# Patient Record
Sex: Male | Born: 1945 | Race: White | Hispanic: No | Marital: Married | Smoking: Former smoker
Health system: Southern US, Community
[De-identification: ages and names within clinical notes are randomized; demographics above are authoritative.]

## PROBLEM LIST (undated history)

## (undated) DIAGNOSIS — I10 Essential (primary) hypertension: Secondary | ICD-10-CM

## (undated) DIAGNOSIS — Z87442 Personal history of urinary calculi: Secondary | ICD-10-CM

## (undated) HISTORY — PX: REPLACEMENT TOTAL KNEE: SUR1224

---

## 2008-02-16 ENCOUNTER — Inpatient Hospital Stay (HOSPITAL_COMMUNITY): Admission: RE | Admit: 2008-02-16 | Discharge: 2008-02-19 | Payer: Self-pay | Admitting: Orthopedic Surgery

## 2008-06-28 ENCOUNTER — Inpatient Hospital Stay (HOSPITAL_COMMUNITY): Admission: RE | Admit: 2008-06-28 | Discharge: 2008-07-01 | Payer: Self-pay | Admitting: Orthopedic Surgery

## 2010-11-13 ENCOUNTER — Ambulatory Visit: Payer: BC Managed Care – PPO | Attending: Orthopedic Surgery | Admitting: Physical Therapy

## 2010-11-13 DIAGNOSIS — M25519 Pain in unspecified shoulder: Secondary | ICD-10-CM | POA: Insufficient documentation

## 2010-11-13 DIAGNOSIS — IMO0001 Reserved for inherently not codable concepts without codable children: Secondary | ICD-10-CM | POA: Insufficient documentation

## 2010-11-13 DIAGNOSIS — M6281 Muscle weakness (generalized): Secondary | ICD-10-CM | POA: Insufficient documentation

## 2010-11-13 DIAGNOSIS — M25619 Stiffness of unspecified shoulder, not elsewhere classified: Secondary | ICD-10-CM | POA: Insufficient documentation

## 2011-01-28 NOTE — Op Note (Signed)
Devin Underwood, Devin Underwood              ACCOUNT NO.:  [id number]   MEDICAL RECORD NO.:  [id number]          PATIENT TYPE:  INP   LOCATION:  0003                         FACILITY:  [facility]   PHYSICIAN:  Georges Lynch. Gioffre, M.D.DATE OF BIRTH:  Jan 05, 1946   DATE OF PROCEDURE:  02/16/2008  DATE OF DISCHARGE:                               OPERATIVE REPORT   SURGEON:  Georges Lynch. [name], M.D.   OPERATIONS:  Jamelle Rushing, P.A.   PREOPERATIVE DIAGNOSIS:  Severe degenerative arthritis, left knee, with  a genu varus deformity.   POSTOPERATIVE DIAGNOSIS:  Severe degenerative arthritis, left knee, with  a genu varus deformity.   OPERATION:  Left total knee arthroplasty utilizing the DePuy system.  The sizes used:  The patella was a size 41-mm patella.  The rotating  platform prosthesis was a size 5, 10-mm thickness.  The femoral  component was a size 5 left posterior-stabilized component femoral  component.  The tibial component was a size 4 tibial tray.  Note,  vancomycin was used in the cement.   PROCEDURE:  Under spinal anesthesia, routine orthopedic prep and drape  of the left lower extremity was carried out.  The patient had 2 g of IV  Ancef preop.  At this time after sterile prep and draping was carried  out, the leg was exsanguinated with an Esmarch, tourniquet was elevated  at 350 mmHg.  The knee was flexed.  The anterior incision was carried  out in the usual fashion.  The knee was extended and two flaps were  created.  I then carried out a median parapatellar incision and the  patella was reflected laterally, the knee was flexed, and I excised the  anterior and posterior cruciate ligaments as well as the medial and  lateral menisci.  At this particular time the initial drill holes made  in the intercondylar notch.  the hole that was widened with the widening  reamer.  We then used our canal finder to locate the canal and then  inserted our #1 jig and removed 12 mm thickness off the  distal femur.  Following that we then utilized our #2 jig and measured our appropriate-  size femur.  We measured it to be a size 5.  Following that we then  utilized our third jig and carried out anterior-posterior chamfering  cuts for a size 5 left femur.  Following that we then prepared the  tibia.  We measured the tibial tray to be a size 4.  We removed the  appropriate amount of bone from the tibia, first by inserting the canal  finder and then we utilized our intramedullary guide and made the  appropriate cut, took 4 mm thickness off the affected medial side.  After this we then cut our keel cut of the proximal tibial plateau.  Once the tibia was prepared we then went and made our notch cut in the  distal femur in the usual fashion.  The trial components were inserted  and we felt that we had the best fit with the size 10-mm thickness  tibial tray.  With the trial  components in place we then did a  resurfacing procedure on the patella after taking the appropriate  measurements.  We selected a size 41-mm patella.  Three drill holes then  were made in the patella and we then inserted our trial patella and we  had an excellent.  All trial components removed.  We thoroughly water-  picked out the knee, dried the knee out, cemented all three components  in simultaneously and, as I said above, vancomycin was used in the  cement.  After the cement was hard we removed all loose pieces of  cement.  We made sure there were no other loose pieces of cement  present.  I also injected 30 mL of 0.25% Marcaine with 30 mg of Toradol  into the surrounding soft tissue.  We thoroughly irrigated out the knee,  made sure there were no other loose pieces of cement.  We rechecked the  knee again for stability.  We had good medial and lateral stability,  good  flexion and extension with the 10-mm thickness insert, so we then  removed the trial insert and inserted our permanent rotating platform  insert.   We reduced the knee, took the knee through motion again.  We  then inserted our Hemovac drain and closed the knee in layers in the  usual fashion.  Sterile Neosporin dressing was applied.           ______________________________  Georges Lynch [name], M.D.     RAG/MEDQ  D:  02/16/2008  T:  02/16/2008  Job:  [id number]

## 2011-01-28 NOTE — H&P (Signed)
Devin Underwood, Devin Underwood              ACCOUNT NO.:  [id number]   MEDICAL RECORD NO.:  [id number]          PATIENT TYPE:  INP   LOCATION:                               FACILITY:  [facility]   PHYSICIAN:  Georges Lynch. Gioffre, M.D.DATE OF BIRTH:  [birth year]   DATE OF ADMISSION:  06/28/2008  DATE OF DISCHARGE:                              HISTORY & PHYSICAL   CHIEF COMPLAINT:  Painful right knee.   HISTORY OF PRESENT ILLNESS:  The patient is a 65 year old gentleman with  history of bilateral knee pain that has failed conservative treatment,  end-stage osteoarthritis on x-rays, had a left total knee arthroplasty  in June, 2009 with good results.  The patient is now going to proceed  with a right total knee arthroplasty by Dr. [name] on June 28, 2008.   ALLERGIES:  No known drug allergies.   FOOD ALLERGIES:  SHELLFISH.   CURRENT MEDICATIONS:  1. Lipitor 10 mg a day.  2. Celebrex 200 mg a day.  3. Over-the-counter Allegra p.r.n.   PAST MEDICAL HISTORY:  1. Seasonal allergies.  2. Hypercholesterolemia.  3. Bilateral knee arthritis with recent left total knee arthroplasty.   PAST SURGICAL HISTORY:  Includes left total knee arthroplasty, bilateral  knee arthroscopies.  He denies any complications to the above-mentioned  surgical procedures.   FAMILY MEDICAL HISTORY:  Both mother and father deceased.  Father is  deceased from an MI.  Mother is deceased from a CVA.   REVIEW OF SYSTEMS:  Is negative for any GENERAL, NEUROLOGIC,  CARDIOVASCULAR, GI, GU, ENDOCRINE or HEMATOLOGIC.   SOCIAL HISTORY:  The patient is married, lives with his wife.  He drinks  a couple of alcoholic beverages a week socially.  Currently retired.   PHYSICAL EXAMINATION:  VITAL SIGNS:  Height is 5 feet, 9 inches.  Weight  is 220 pounds.  Pulse of 70 and regular.  Respirations 12.  Blood  pressure is 136/88.  The patient is afebrile.  GENERAL:  This is a healthy-appearing well-developed gentleman who is  conscious,  alert and appropriate.  He appears to be a good historian.  He walks with a very brisk easy gait.  HEENT:  Head was normocephalic.  Pupils equal, round, reactive.  Extraocular movements intact. Gross hearing is intact.  NECK:  Supple, no palpable lymphadenopathy.  Good range of motion.  CHEST:  Lung sounds were clear and equal bilaterally with no rales,  rhonchi or wheezing.  HEART:  Regular rate and rhythm, normal murmurs, rubs or gallops.  ABDOMEN:  Soft, nontender, bowel sounds present.  EXTREMITIES:  Upper extremities have excellent range of motion of his  shoulders, elbows and wrists.  Motor strength is 5/5.  In the lower  extremities both hips have full extension and flexion with  internal/external rotation without any discomfort.  Right knee had full  extension. He was able to flex it back to 120 degrees.  Did have  crepitus in the knee.  He had no effusion, no signs of infection.  His  left knee had a well-healed midline surgical incision.  He was able to  easily  fully extend it.  He could flex it back to about 115 degrees.  He  had no instability.  Both calves were soft and nontender.  He had good  motion in both ankles.  PERIPHERAL VASCULAR:  Carotid pulses were 2+ with no bruits.  Radial  pulses ere 2+.  Posterior tibial pulses were 1+.  He had no lower  extremity edema.  He had no venous stasis changes.  No pigmentation  changes.  NEUROLOGICAL:  Patient was conscious, alert and appropriate.  No gross  neurological defects.  BREAST/RECTAL/GENITOURINARY:  Exam's were deferred at this time.   IMPRESSION:  1. End-stage osteoarthritis right knee with recent left total knee      arthroplasty.  2. Hypercholesterolemia.  3. Seasonal allergies.   PLAN:  The patient will undergo all routine labs and tests prior to  having a right total knee arthroplasty by Dr. [name] at [facility] on June 28, 2008.      Jamelle Rushing, P.A.     ______________________________  Georges Lynch [name], M.D.    RWK/MEDQ  D:  06/20/2008  T:  06/20/2008  Job:  [id number]

## 2011-01-28 NOTE — Op Note (Signed)
NAMEREYNARD, Devin Underwood              ACCOUNT NO.:  [id number]   MEDICAL RECORD NO.:  [id number]          PATIENT TYPE:  INP   LOCATION:  0007                         FACILITY:  [facility]   PHYSICIAN:  Georges Lynch. Gioffre, M.D.DATE OF BIRTH:  12-31-1945   DATE OF PROCEDURE:  06/28/2008  DATE OF DISCHARGE:                               OPERATIVE REPORT   SURGEON:  Georges Lynch. [name], M.D.   ASSISTANT:  Jamelle Rushing, P.A. and Donnie Coffin. Durwin Nora, P.A.   PREOPERATIVE DIAGNOSIS:  Severe degenerative arthritis involving the  right knee.   POSTOPERATIVE DIAGNOSIS:  Severe degenerative arthritis involving the  right knee.   OPERATION:  Total knee arthroplasty on the right.   COMPONENTS USED:  The DePuy system and I cemented all three components  in simultaneously.   SIZES USED:  We used a 38 patella with three pegs, tibial tray was a  size 5, the femur was a size 5, right femoral posterior stabilized.  The  insert was a size 5, 15 mm thickness and I used vancomycin in the  cement.   PROCEDURE:  Under spinal anesthesia routine orthopedic prepping and  draping of the right lower extremity carried out.  At this time, he had  2 grams of IV Ancef.  Following that we utilized the Esmarch to  exsanguinate the leg.  The tourniquet was elevated to 350 mmHg.  At that  time I flexed the knee and the midline incision was made in the usual  fashion.  Bleeders identified and cauterized.  Self-retaining retractors  were inserted after two flaps were created.  I then carried out a median  parapatellar incision reflecting the patella laterally, flexed the knee  and did medial and lateral meniscectomies and excised the anterior and  posterior cruciate ligaments.  At this time initial drill hole was made  in the intercondylar notch.  I removed 12 mm thickness off the distal  femur.  Following that, #2 jig was inserted for measurement purposes.  We measured the femur to be a size 5 right.  I then inserted a third  jig  and carried out anterior-posterior chamfer cuts for a size 5 right  femur.  Following that I then prepared the tibia in the usual fashion  utilizing intramedullary guide rod.  I removed 4 mm thickness off the  affected medial side of the tibia.  At this time we then cut our keel  cut onto the tibia.  After the tibia was prepared we went back to the  femur and did our notch cut in the usual fashion.  I thoroughly water  picked out the knee, then inserted our temporary trial prosthesis.  We  selected a 15-mm thickness insert.  Following that I went ahead and did  a resurfacing procedure on the patella in the usual fashion for a size  38 mm patella.  After that we then removed all trial components,  thoroughly water picked the knee out, dried the knee out and cemented  all three components in simultaneously and I did use vancomycin in the  cement.  At this time we then  removed all loose pieces of cement, water  picked the knee out again and then removed our trial insert and put our  permanent size 5, 15 mm thickness insert.  We reduced the knee, had  excellent function as far as flexion/extension and lateral medial  lateral stability.  I then inserted the  Hemovac drain and closed the knee in layered usual fashion.  Note prior  to inserting our permanent prosthesis I did inject 30 mL of 0.25%  Marcaine with epinephrine and 30 mg of Toradol.  As I said, the wound  was closed in layers in usual fashion.  Skin was closed with metal  staples.  Sterile Neosporin dressing was applied.           ______________________________  Georges Lynch [name], M.D.     RAG/MEDQ  D:  06/28/2008  T:  06/28/2008  Job:  [id number]

## 2011-01-28 NOTE — H&P (Signed)
Devin Underwood, Devin Underwood              ACCOUNT NO.:  [id number]   MEDICAL RECORD NO.:  [id number]          PATIENT TYPE:   LOCATION:                                 FACILITY:   PHYSICIAN:  Georges Lynch. Gioffre, M.D.DATE OF BIRTH:  06-22-1946   DATE OF ADMISSION:  02/16/2008  DATE OF DISCHARGE:                              HISTORY & PHYSICAL   CHIEF COMPLAINT:  Painful bilateral knees.   HISTORY OF PRESENT ILLNESS:  Devin Underwood is a 65 year old gentleman here  today for his history and physical for his upcoming left total knee  arthroplasty by Dr. [name] at [facility] on February 16, 2008.  The patient has been treated by Dr. [name] conservatively for bilateral  knee arthritic changes with medial joint bone on bone.  The patient has  finally failed to have continued improvement with conservative  treatment.  The patient has elected proceed with a left total knee  arthroplasty.   ALLERGIES:  NO KNOWN DRUG ALLERGIES.   FOOD ALLERGIES:  SHRIMP.   He does also have seasonal allergies.   CURRENT MEDICATIONS:  1. Lipitor 10 mg a day.  2. Over-the-counter Allegra.  3. Celebrex 200 mg a day.   PAST MEDICAL HISTORY:  1. Seasonal allergies.  2. Hypercholesterolemia.  3. Bilateral knee osteoarthritis.  4. Childhood asthma.   PAST SURGICAL HISTORY:  Arthroscopic procedures bilateral knees in the  mid 1990s.  The patient denies any problems with anesthesia.   REVIEW OF SYSTEMS:  Negative for any neurologic, cardiovascular, GI, GU,  endocrine and hematologic issue.  His is only issue is the pulmonary  issue with his seasonal allergies.  He did have childhood asthma.  He  has never been hospitalized.   FAMILY MEDICAL HISTORY:  Both mother and father deceased, father from an  MI, mother from CVA.   SOCIAL HISTORY:  The patient is married and lives with his wife.  He has  smoked in the past.  He drinks about two beers a week.  He is currently  retired, lives in a one-story  home.   PHYSICAL EXAMINATION:  VITAL SIGNS:  Height is 5 feet 9 inches.  Weight  is 225.  Blood pressure is 132/82, pulse of 68 and regular, respirations  are 12, nonlabored, patient is afebrile.  GENERAL:  This is a healthy-appearing gentleman, conscious, alert and  appropriate.  Appears to be a good historian.  Appears to be in no  extreme distress.  HEENT:  Head was normocephalic.  Pupils equal, round and reactive.  Good  range of motion of his cervical spine without any lymphadenopathy.  CHEST:  Lung sounds were clear and equal bilaterally.  No wheezes, rales  or rhonchi.  HEART:  Regular rate and rhythm.  No murmurs, rubs or gallops.  ABDOMEN:  Soft, nontender.  Bowel sounds present.  UPPER EXTREMITIES:  Had excellent range of motion of the shoulders,  elbows and wrists.  LOWER EXTREMITIES:  Both hips had full range of motion without any  discomfort.  The patient did have some very obvious varus deformities of  bilateral knees.  He had  full extension of both knees, flexion back to  120 degrees.  He had no ligament instability.  He had no effusions, no  signs of infection at either knee.  Calves were soft and nontender.  He  had good range of motion of both ankles.  PERIPHERAL VASCULAR:  Carotid pulses were 2+, no bruits.  Radial pulses  were 2+.  Posterior tibial pulses were 2+.  He had no lower extremity  edema.  NEURO:  The patient was conscious, alert, a little anxious.  There  appeared to be no gross neurologic defects.  BREAST/RECTAL/GU:  Deferred at this time.   IMPRESSION:  1. Severe osteoarthritis bilateral knees with loss of medial joint      spacing.  2. Hypercholesterolemia.  3. Seasonal allergies.  4. History of childhood asthma.   PLAN:  The patient will undergo all routine labs and tests prior to  having a left total knee arthroplasty by Dr. [name] at [facility] on February 16, 2008.      Jamelle Rushing, P.A.    ______________________________   Georges Lynch [name], M.D.    RWK/MEDQ  D:  02/03/2008  T:  02/03/2008  Job:  [id number]   cc:   Windy Fast A. [name], M.D.  Fax: [phone]

## 2011-01-31 NOTE — Discharge Summary (Signed)
[name], [name]              ACCOUNT NO.:  [id number]   MEDICAL RECORD NO.:  [id number]          PATIENT TYPE:  INP   LOCATION:  1619                         FACILITY:  [facility]   PHYSICIAN:  Georges Lynch. Gioffre, M.D.DATE OF BIRTH:  1946/05/09   DATE OF ADMISSION:  06/28/2008  DATE OF DISCHARGE:  07/01/2008                               DISCHARGE SUMMARY   DISPOSITION:  Home.   ADMISSION DIAGNOSES:  1. End-stage osteoarthritis right knee with recent left total knee      arthroplasty.  2. Hypercholesterolemia.  3. Seasonal allergies.   DISCHARGE DIAGNOSES:  1. Right total knee arthroplasty.  2. Routine postoperative blood loss.  3. Hypercholesterolemia.  4. Seasonal allergies.   HISTORY OF PRESENT ILLNESS:  The patient is a 65 year old gentleman with  history of bilateral knee pains progressively worsened over time.  The  patient failed conservative treatment including injections.  The patient  had end-stage osteoarthritis on his knee x-rays.  The patient had a left  total knee arthroplasty in June 2009 and did very well.  The patient has  elected to proceed with a right total knee arthroplasty.   ALLERGIES:  NO KNOWN DRUG ALLERGIES.   FOOD ALLERGIES:  SHELLFISH.   MEDICATIONS ON ADMISSION:  1. Lipitor 10 mg a day.  2. Celebrex 200 mg a day.  3. Allegra over-the-counter p.r.n.   SURGICAL PROCEDURE:  On June 28, 2008, the patient was taken to the  OR by Dr. Worthy Rancher assisted by Oneida Alar PA-C and Evern Core PA-C.  Under general anesthesia, the patient underwent a right total knee  arthroplasty without any complications.  He had minimal blood loss.  The  patient was transferred to the recovery room and then to the orthopedic  floor in good condition.  The patient had the following components  implanted:  A size 5 Sigma femoral component, size 5 keel tibial tray,  size 5 15-mm thickness polyethylene bearing, size 38  3-peg patella.  All components were implanted with  polymethyl methacrylate with  vancomycin mixed in.   CONSULTATIONS:  The following routine consults on request:  Physical  therapy,occupational therapy, case management, pharmacy.   HOSPITAL COURSE:  On June 28, 2008, the patient was admitted to  [facility] under the care of Dr. Worthy Rancher.  The patient was  taken to the OR where a right total knee arthroplasty was performed.  The patient tolerated the procedure well and was transferred to the  recovery room in good condition on IV pain medicines, antibiotics,  Coumadin and heparin for DVT prophylaxis.  The patient incurred 3 days  postoperative from which the patient did very well.  He had a little  bleeding from his wound initially, but that was well controlled with  just a pressure dressing.  The patient's wound remained benign for any  signs of infection.  The leg remained neuromotor and vascularly intact.  The patient's vital signs remained stable.  The patient had the typical  postoperative temperatures, but no signs of infection or any other  complications.  The patient worked well with physical therapy.  He was  able to wean off his IV medications to p.o. meds well without any  issues.  His hemoglobin did drop to 10.1 and 29.5 hematocrit, but the  patient tolerated this very well and was able to participate in physical  therapy.  He was able to ambulate greater than 220 feet on his date of  discharge without any difficulties.  An INR was 1.5.  He was discharged  in good condition to outpatient home health physical therapy and RN for  Coumadin monitoring.   LABORATORY DATA:  CBC on admission found WBC 6.6, hemoglobin 14.8,  hematocrit 43.4, platelets 178.  His H&H on discharge was 10.1 and 29.5.  Vital signs were stable.  The patient tolerated this well without any  other issues.  He was able to self correct with p.o. supplements.  INR  on discharge was 1.5.  On admission, his chemistries were within normal   limits.  Estimated GFR greater than 60.  Urinalysis on admission was  within normal limits.   DISCHARGE INSTRUCTIONS:  1. Diet:  No restrictions.  2. Activity:  The patient is to slowly increase activity with the use      of a walker and instructions with physical therapy.  3. Wound care:  The patient is to change the dressing on a daily      basis.   FOLLOW UP:  1. The patient needs a follow up appointment with Dr. [name] in 2      weeks from his date of surgery.  The patient is to call [phone]      for that follow up appointment.  2. Home health physical therapy through [facility] with      Coumadin monitoring.   DISCHARGE MEDICATIONS:  1. Percocet 10/650 on tablet every 4-6 hours for pain if needed.  2. Coumadin 500 mg once every 6 hours for muscle spasms if needed.  3. Coumadin 7.5 mg once a day unless changed by pharmacist through      [facility].  4. Fexofenadine 180 mg once a day.  5. Celebrex on hold until completed Coumadin.  6. Lipitor 10 mg a day.  7. Aspirin on hold until completed Coumadin.   CONDITION ON DISCHARGE:  The patient's condition upon discharge to home  is listed improved and good.      Jamelle Rushing, P.A.    ______________________________  Georges Lynch [name], M.D.   RWK/MEDQ  D:  07/19/2008  T:  07/19/2008  Job:  [id number]   cc:   Windy Fast A. [name], M.D.  Fax: [phone]

## 2011-06-11 LAB — DIFFERENTIAL
Basophils Relative: 0
Lymphocytes Relative: 27
Lymphs Abs: 1.6
Monocytes Absolute: 0.5

## 2011-06-11 LAB — URINALYSIS, ROUTINE W REFLEX MICROSCOPIC
Bilirubin Urine: NEGATIVE
Glucose, UA: NEGATIVE
Ketones, ur: NEGATIVE
Nitrite: NEGATIVE

## 2011-06-11 LAB — COMPREHENSIVE METABOLIC PANEL
ALT: 21
Albumin: 3.9
Calcium: 9.7
GFR calc Af Amer: >60
GFR calc non Af Amer: >60
Total Bilirubin: 1.2
Total Protein: 7

## 2011-06-11 LAB — APTT: aPTT: 26

## 2011-06-11 LAB — CBC
Platelets: 159
RDW: 13.1
WBC: 6.1

## 2011-06-11 LAB — PROTIME-INR
INR: 0.9
Prothrombin Time: 11.9

## 2011-06-12 LAB — PROTIME-INR
INR: 1
INR: 1.3
INR: 1.3
Prothrombin Time: 16.7 — ABNORMAL HIGH
Prothrombin Time: 16.8 — ABNORMAL HIGH

## 2011-06-12 LAB — HEMOGLOBIN AND HEMATOCRIT, BLOOD
HCT: 29.4 — ABNORMAL LOW
HCT: 32.7 — ABNORMAL LOW
Hemoglobin: 11.3 — ABNORMAL LOW
Hemoglobin: 13.8

## 2011-06-12 LAB — TYPE AND SCREEN: Antibody Screen: NEGATIVE

## 2011-06-12 LAB — CBC
Hemoglobin: 9.8 — ABNORMAL LOW
MCV: 88.8
RBC: 3.18 — ABNORMAL LOW

## 2011-06-12 LAB — ABO/RH: ABO/RH(D): O POS

## 2011-06-16 LAB — COMPREHENSIVE METABOLIC PANEL
Albumin: 3.7
Alkaline Phosphatase: 75
BUN: 16
Chloride: 105
Creatinine, Ser: 0.92
GFR calc non Af Amer: >60
Potassium: 3.9
Total Bilirubin: 1.3 — ABNORMAL HIGH
Total Protein: 6.4

## 2011-06-16 LAB — PROTIME-INR
INR: 1
INR: 1.1
INR: 1.4
INR: 1.5
Prothrombin Time: 18.1 — ABNORMAL HIGH

## 2011-06-16 LAB — CBC
Hemoglobin: 14.8
MCHC: 34.2
RBC: 4.98
RDW: 13.6

## 2011-06-16 LAB — DIFFERENTIAL
Basophils Absolute: 0
Basophils Relative: 0
Eosinophils Absolute: 0.2
Lymphs Abs: 1.6
Monocytes Absolute: 0.5
Monocytes Relative: 8
Neutro Abs: 4.2

## 2011-06-16 LAB — URINALYSIS, ROUTINE W REFLEX MICROSCOPIC
Bilirubin Urine: NEGATIVE
Hgb urine dipstick: NEGATIVE
Protein, ur: NEGATIVE

## 2011-06-16 LAB — HEMOGLOBIN AND HEMATOCRIT, BLOOD: Hemoglobin: 10.8 — ABNORMAL LOW

## 2011-06-16 LAB — TYPE AND SCREEN
ABO/RH(D): O POS
Antibody Screen: NEGATIVE

## 2021-07-18 ENCOUNTER — Encounter (HOSPITAL_BASED_OUTPATIENT_CLINIC_OR_DEPARTMENT_OTHER): Payer: Self-pay | Admitting: Emergency Medicine

## 2021-07-18 ENCOUNTER — Emergency Department (HOSPITAL_BASED_OUTPATIENT_CLINIC_OR_DEPARTMENT_OTHER)
Admission: EM | Admit: 2021-07-18 | Discharge: 2021-07-18 | Disposition: A | Discharge status: Home / Self Care | Payer: Medicare PPO

## 2021-07-18 ENCOUNTER — Emergency Department (HOSPITAL_BASED_OUTPATIENT_CLINIC_OR_DEPARTMENT_OTHER): Payer: Medicare PPO

## 2021-07-18 ENCOUNTER — Other Ambulatory Visit: Payer: Self-pay

## 2021-07-18 ENCOUNTER — Emergency Department (HOSPITAL_BASED_OUTPATIENT_CLINIC_OR_DEPARTMENT_OTHER)
Admission: EM | Admit: 2021-07-18 | Discharge: 2021-07-18 | Disposition: A | Discharge status: Home / Self Care | Payer: Medicare PPO | Attending: Emergency Medicine | Admitting: Emergency Medicine

## 2021-07-18 DIAGNOSIS — I10 Essential (primary) hypertension: Secondary | ICD-10-CM | POA: Insufficient documentation

## 2021-07-18 DIAGNOSIS — N132 Hydronephrosis with renal and ureteral calculous obstruction: Secondary | ICD-10-CM | POA: Diagnosis not present

## 2021-07-18 DIAGNOSIS — R109 Unspecified abdominal pain: Secondary | ICD-10-CM | POA: Diagnosis present

## 2021-07-18 DIAGNOSIS — Z87891 Personal history of nicotine dependence: Secondary | ICD-10-CM | POA: Insufficient documentation

## 2021-07-18 DIAGNOSIS — N23 Unspecified renal colic: Secondary | ICD-10-CM

## 2021-07-18 HISTORY — DX: Essential (primary) hypertension: I10

## 2021-07-18 LAB — CBC
HCT: 42.4 % (ref 39.0–52.0)
Hemoglobin: 14.2 g/dL (ref 13.0–17.0)
MCH: 29.8 pg (ref 26.0–34.0)
MCHC: 33.5 g/dL (ref 30.0–36.0)
MCV: 88.9 fL (ref 80.0–100.0)
Platelets: 179 10*3/uL (ref 150–400)
RBC: 4.77 MIL/uL (ref 4.22–5.81)
RDW: 12.9 % (ref 11.5–15.5)
WBC: 13 10*3/uL — ABNORMAL HIGH (ref 4.0–10.5)
nRBC: 0 % (ref 0.0–0.2)

## 2021-07-18 LAB — URINALYSIS, ROUTINE W REFLEX MICROSCOPIC
Bilirubin Urine: NEGATIVE
Glucose, UA: NEGATIVE mg/dL
Ketones, ur: NEGATIVE mg/dL
Leukocytes,Ua: NEGATIVE
Nitrite: NEGATIVE
Protein, ur: 30 mg/dL — AB
RBC / HPF: >50 RBC/hpf — ABNORMAL HIGH (ref 0–5)
Specific Gravity, Urine: 1.024 (ref 1.005–1.030)
pH: 6 (ref 5.0–8.0)

## 2021-07-18 LAB — BASIC METABOLIC PANEL
Anion gap: 7 (ref 5–15)
BUN: 23 mg/dL (ref 8–23)
CO2: 28 mmol/L (ref 22–32)
Calcium: 9.1 mg/dL (ref 8.9–10.3)
Chloride: 104 mmol/L (ref 98–111)
Creatinine, Ser: 1.03 mg/dL (ref 0.61–1.24)
GFR, Estimated: >60 mL/min (ref >60)
Glucose, Bld: 122 mg/dL — ABNORMAL HIGH (ref 70–99)
Potassium: 4.4 mmol/L (ref 3.5–5.1)
Sodium: 139 mmol/L (ref 135–145)

## 2021-07-18 MED ORDER — OXYCODONE-ACETAMINOPHEN 5-325 MG PO TABS: 0.5000 | ORAL_TABLET | Freq: Four times a day (QID) | ORAL | 0 refills | Status: DC | PRN

## 2021-07-18 MED ORDER — TAMSULOSIN HCL 0.4 MG PO CAPS: 0.4000 mg | ORAL_CAPSULE | Freq: Every day | ORAL | 0 refills | Status: AC

## 2021-07-18 NOTE — ED Provider Notes (Signed)
[facility] EMERGENCY DEPT Provider Note   CSN: [id number] Arrival date & time: 07/18/21  1121     History Chief Complaint  Patient presents with   Flank Pain    right    Devin Underwood is a 75 y.o. male.  Patient with history of hypertension presents to the emergency department today for evaluation of acute onset of right flank pain late this morning.  He had associated nausea but no vomiting.  No chest pain or shortness of breath.  No stool changes recently, at least since he had COVID 2 weeks ago.  No hematuria, dysuria, increased frequency or urgency.  Upon arrival to the emergency department, his pain seemed to "ease off".  He is more comfortable now.  Patient's wife states that he was very uncomfortable and in a lot of pain earlier.  No history of kidney stones.      Past Medical History:  Diagnosis Date   Hypertension     There are no problems to display for this patient.   The histories are not reviewed yet. Please review them in the "History" navigator section and refresh this SmartLink.     No family history on file.  Social History   Tobacco Use   Smoking status: Former    Types: Cigarettes  Substance Use Topics   Alcohol use: Yes   Drug use: Not Currently    Home Medications Prior to Admission medications   Not on File    Allergies    Patient has no known allergies.  Review of Systems   Review of Systems  Constitutional:  Negative for fever.  HENT:  Negative for rhinorrhea and sore throat.   Eyes:  Negative for redness.  Respiratory:  Negative for cough.   Cardiovascular:  Negative for chest pain.  Gastrointestinal:  Positive for nausea. Negative for abdominal pain, diarrhea and vomiting.  Genitourinary:  Positive for flank pain. Negative for dysuria and hematuria.  Musculoskeletal:  Negative for myalgias.  Skin:  Negative for rash.  Neurological:  Negative for headaches.   Physical Exam Updated Vital Signs BP (!) 153/76  (BP Location: Right Arm)   Pulse (!) 57   Temp 97.7 F (36.5 C) (Oral)   Resp 18   Ht 5\' 9"  (1.753 m)   Wt 104.3 kg   SpO2 98%   BMI 33.97 kg/m   Physical Exam Vitals and nursing note reviewed.  Constitutional:      General: He is not in acute distress.    Appearance: He is well-developed.  HENT:     Head: Normocephalic and atraumatic.  Eyes:     General:        Right eye: No discharge.        Left eye: No discharge.     Conjunctiva/sclera: Conjunctivae normal.  Cardiovascular:     Rate and Rhythm: Normal rate and regular rhythm.     Heart sounds: Normal heart sounds.  Pulmonary:     Effort: Pulmonary effort is normal.     Breath sounds: Normal breath sounds.  Abdominal:     Palpations: Abdomen is soft.     Tenderness: There is no abdominal tenderness. There is no right CVA tenderness, left CVA tenderness, guarding or rebound.  Musculoskeletal:     Cervical back: Normal range of motion and neck supple.  Skin:    General: Skin is warm and dry.  Neurological:     Mental Status: He is alert.    ED Results / Procedures /  Treatments   Labs (all labs ordered are listed, but only abnormal results are displayed) Labs Reviewed  BASIC METABOLIC PANEL - Abnormal; Notable for the following components:      Result Value   Glucose, Bld 122 (*)    All other components within normal limits  CBC - Abnormal; Notable for the following components:   WBC 13.0 (*)    All other components within normal limits  URINALYSIS, ROUTINE W REFLEX MICROSCOPIC - Abnormal; Notable for the following components:   Hgb urine dipstick LARGE (*)    Protein, ur 30 (*)    RBC / HPF >50 (*)    Bacteria, UA FEW (*)    Non Squamous Epithelial 0-5 (*)    All other components within normal limits    EKG None  Radiology CT Renal Stone Study  Result Date: 07/18/2021 CLINICAL DATA:  Right flank pain beginning today EXAM: CT ABDOMEN AND PELVIS WITHOUT CONTRAST TECHNIQUE: Multidetector CT imaging of  the abdomen and pelvis was performed following the standard protocol without IV contrast. COMPARISON:  None. FINDINGS: Lower chest: Calcified nodules at the lung bases indicative of prior granulomatous inflammation. Hepatobiliary: No focal liver abnormality is seen. No gallstones, gallbladder wall thickening, or biliary dilatation. Pancreas: Unremarkable. No pancreatic ductal dilatation or surrounding inflammatory changes. Spleen: No splenic injury or perisplenic hematoma. Adrenals/Urinary Tract: Adrenal glands are normal in appearance. Mild right hydronephrosis and hydroureter due to 4 mm obstructing calculus in the right proximal ureter. Minimal right perinephric fat stranding is present. Additional punctate nonobstructing calculus seen in the mid right kidney. Kidneys otherwise normal. No significant abnormality of the bladder. Stomach/Bowel: Stomach is within normal limits. Appendix is not definitively identified. No evidence of bowel wall thickening, distention, or inflammatory changes. Vascular/Lymphatic: No enlarged abdominal or pelvic lymph nodes. There are scattered calcified atheromatous plaque seen throughout the abdominal aorta. Reproductive: Mildly enlarged prostate with central calcifications. 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 Musculoskeletal: Advanced degenerative changes of the lumbar spine. IMPRESSION: 4 mm obstructing calculus in the right proximal ureter resulting in mild right hydronephrosis and hydroureter. Electronically Signed   By: Miachel Roux M.D.   On: 07/18/2021 13:27    Procedures Procedures   Medications Ordered in ED Medications - No data to display  ED Course  I have reviewed the triage vital signs and the nursing notes.  Pertinent labs & imaging results that were available during my care of the patient were reviewed by me and considered in my medical decision making (see chart for details).  Patient seen and examined.  Patient with elevated white  blood cell count and UA shows microscopic hematuria.  History is consistent with renal colic, however patient has never had a kidney stone in the past.  Discussed imaging with patient and family.  They elect to proceed to rule out other potential etiologies.  Noncontrast CT ordered.  Vital signs reviewed and are as follows: BP (!) 153/76 (BP Location: Right Arm)   Pulse (!) 57   Temp 97.7 F (36.5 C) (Oral)   Resp 18   Ht 5\' 9"  (1.753 m)   Wt 104.3 kg   SpO2 98%   BMI 33.97 kg/m   Patient and family updated on results.  CT imaging demonstrates 4 mm right proximal ureteral stone.  No other problems.  Pain remains controlled at this time.  Patient requesting discharge.  1:50 PM Patient counseled on kidney stone treatment. Urged patient to strain urine and save any stones. Urged  urology follow-up and return to [facility] with any complications. Counseled patient to maintain good fluid intake.   Counseled patient on use of Flomax.   Patient counseled on use of narcotic pain medications. Counseled not to combine these medications with others containing tylenol. Urged not to drink alcohol, drive, or perform any other activities that requires focus while taking these medications. The patient verbalizes understanding and agrees with the plan.  Use pain medication only under direct supervision at the lowest possible dose needed to control your pain.      MDM Rules/Calculators/A&P                           Patient with description of flank pain consistent with ureteral colic.  This is his first episode.  CT confirms 4 mm ureteral stone.  Patient's pain actually resolved upon arrival to the emergency department and has not returned.  Plan for discharged home with pain control, Flomax, urology follow-up.  Encouraged good hydration.    Final Clinical Impression(s) / ED Diagnoses Final diagnoses:  Ureteral colic    Rx / DC Orders ED Discharge Orders          Ordered    oxyCODONE-acetaminophen  (PERCOCET/ROXICET) 5-325 MG tablet  Every 6 hours PRN        07/18/21 1348    tamsulosin (FLOMAX) 0.4 MG CAPS capsule  Daily        07/18/21 1348             Carlisle Cater, PA-C 07/18/21 1351    Regan Lemming, MD 07/18/21 1454

## 2021-07-18 NOTE — ED Triage Notes (Signed)
Right flank pain started today , denies hx kidney stones , denies urinary symptoms.

## 2021-07-18 NOTE — Discharge Instructions (Signed)
Please read and follow all provided instructions.  Your diagnoses today include:  1. Ureteral colic     Tests performed today include: Urine test that showed blood in your urine and no infection CT scan which showed a 4 millimeter kidney on the right side Blood test that showed normal kidney function Vital signs. See below for your results today.   Medications prescribed:  Percocet (oxycodone/acetaminophen) - narcotic pain medication  DO NOT drive or perform any activities that require you to be awake and alert because this medicine can make you drowsy. BE VERY CAREFUL not to take multiple medicines containing Tylenol (also called acetaminophen). Doing so can lead to an overdose which can damage your liver and cause liver failure and possibly death.  Use pain medication only under direct supervision at the lowest possible dose needed to control your pain.   Flomax (tamsulosin) - relaxes smooth muscle to help kidney stones pass  Take any prescribed medications only as directed.  Home care instructions:  Follow any educational materials contained in this packet.  Please double your fluid intake for the next several days. Strain your urine and save any stones that may pass.   BE VERY CAREFUL not to take multiple medicines containing Tylenol (also called acetaminophen). Doing so can lead to an overdose which can damage your liver and cause liver failure and possibly death.   Follow-up instructions: Please follow-up with your urologist or the urologist referral (provided on front page) in the next 1 week for further evaluation of your symptoms.  If you need to return to the Emergency Department, go to [facility] and not [facility]. The urologists are located at [facility] and can better care for you at this location.  Return instructions:  If you need to return to the Emergency Department, go to [facility] and not [facility]. The urologists are  located at [facility] and can better care for you at this location.  Please return to the Emergency Department if you experience worsening symptoms.  Please return if you develop fever or uncontrolled pain or vomiting. Please return if you have any other emergent concerns.  Additional Information:  Your vital signs today were: BP (!) 147/81   Pulse 82   Temp 97.7 F (36.5 C) (Oral)   Resp 18   Ht 5\' 9"  (1.753 m)   Wt 104.3 kg   SpO2 98%   BMI 33.97 kg/m  If your blood pressure (BP) was elevated above 135/85 this visit, please have this repeated by your doctor within one month. --------------

## 2023-10-14 IMAGING — CT CT RENAL STONE PROTOCOL
2 of 4 series · 16 of 46 positions shown, 18 images · non-contrast
Comparison: None.

CLINICAL DATA: Right flank pain beginning today

EXAM:
CT ABDOMEN AND PELVIS WITHOUT CONTRAST
TECHNIQUE: Multidetector CT imaging of the abdomen and pelvis was performed
following the standard protocol without IV contrast.

[Series 2: stone full · axial · 0.84mm/px · z∈[-644,-164]mm · 13 of 106 slices shown, 15 images]
[im 5/106  soft-tissue]
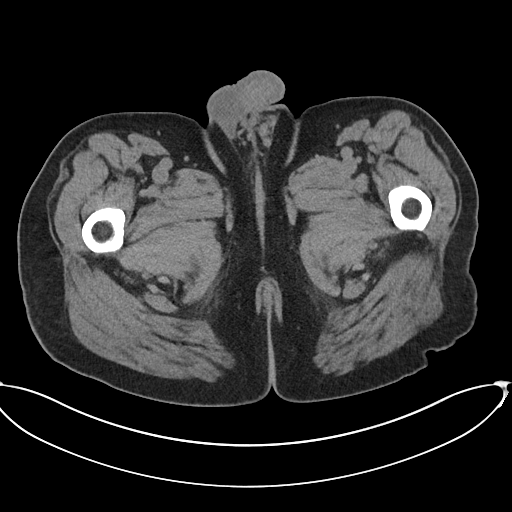
[im 5/106  bone]
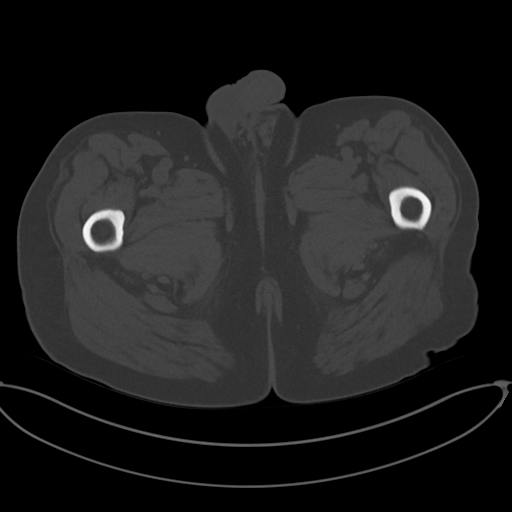
[im 14/106  soft-tissue]
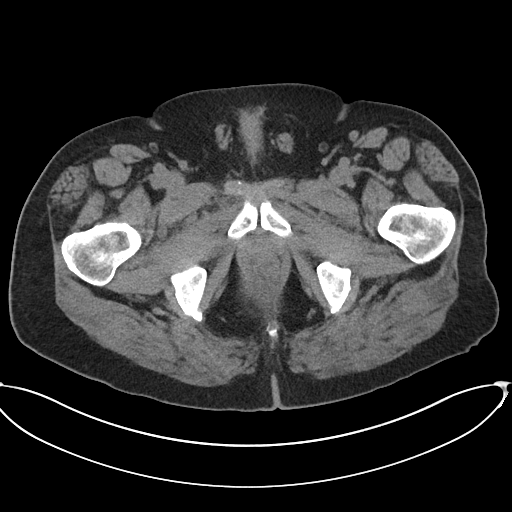
[im 23/106  soft-tissue]
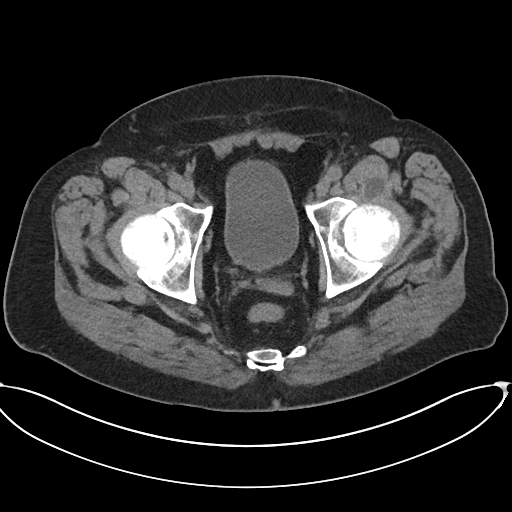
[im 28/106  soft-tissue]
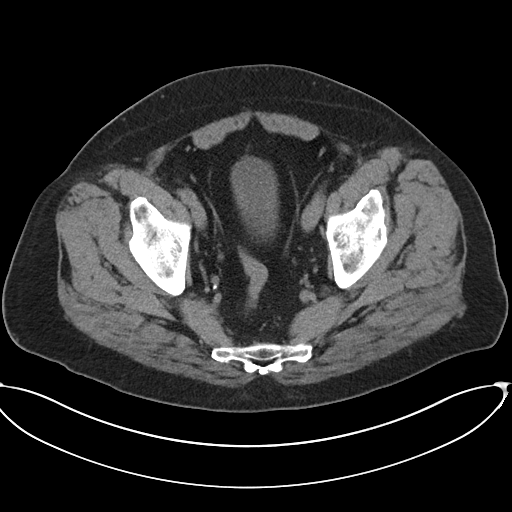
[im 37/106  soft-tissue]
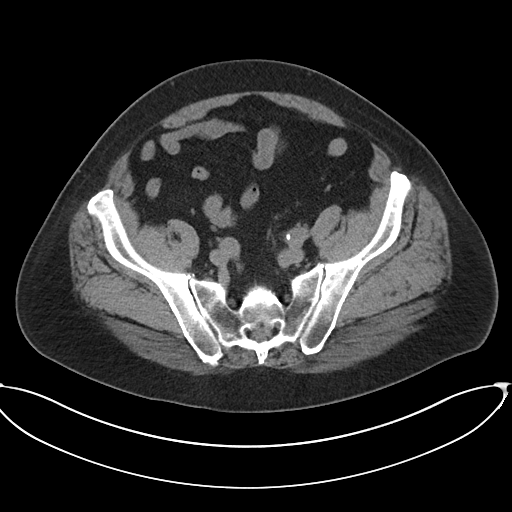
[im 46/106  soft-tissue]
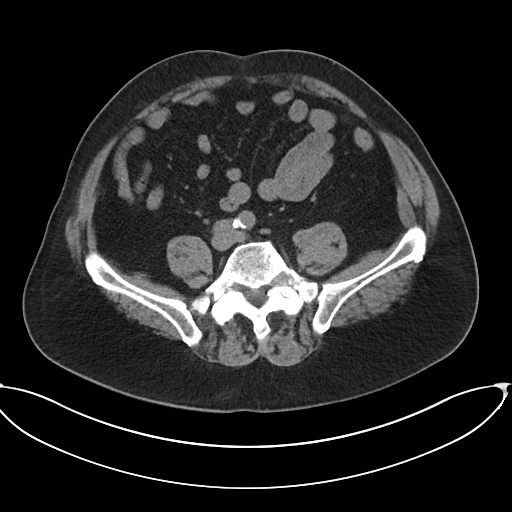
[im 55/106  soft-tissue]
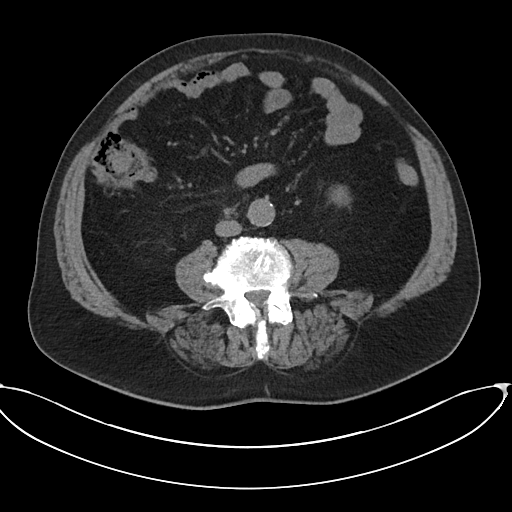
[im 60/106  soft-tissue]
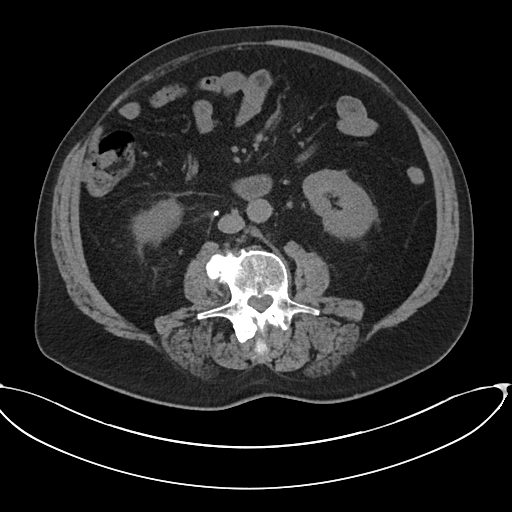
[im 69/106  soft-tissue]
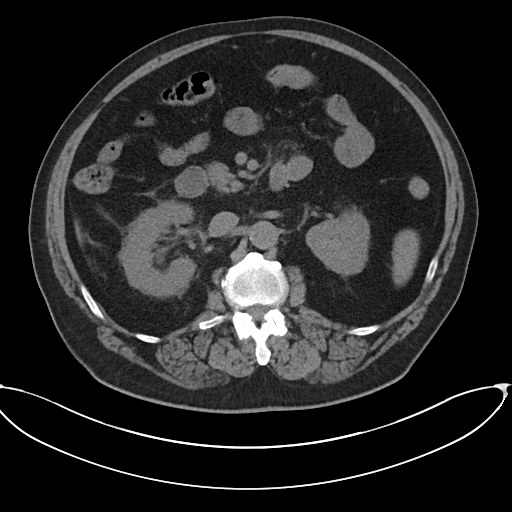
[im 69/106  bone]
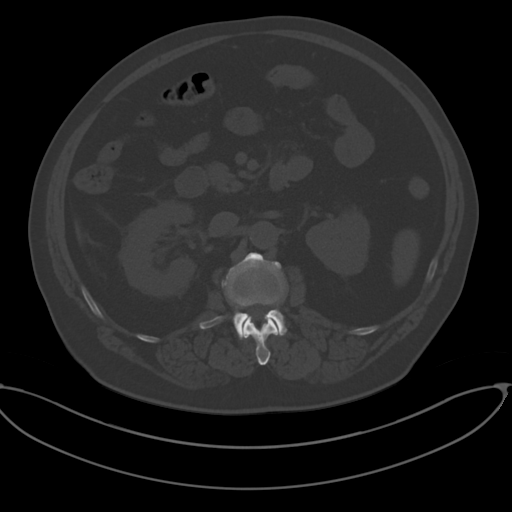
[im 78/106  soft-tissue]
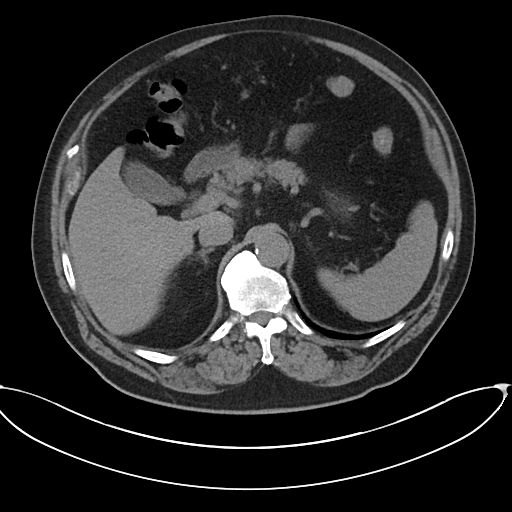
[im 83/106  soft-tissue]
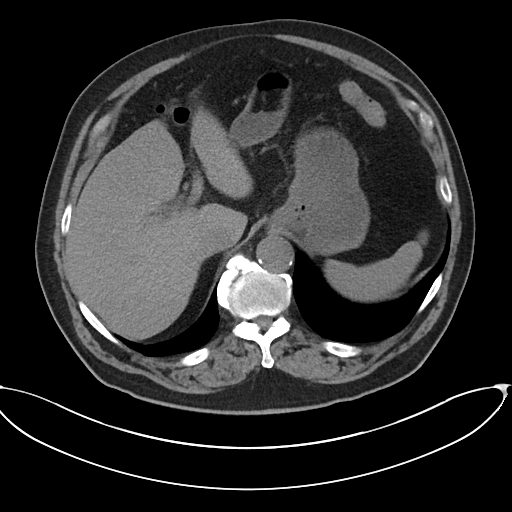
[im 92/106  soft-tissue]
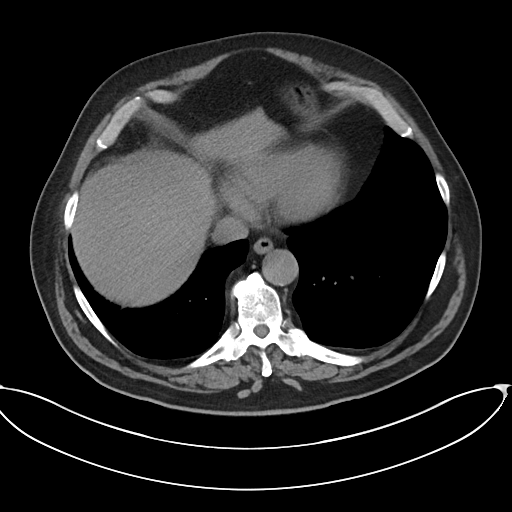
[im 101/106  soft-tissue]
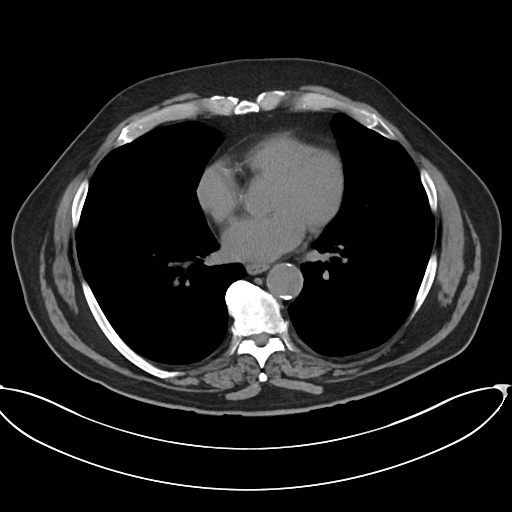

[Series 5: coronal · coronal · 0.85mm/px · 3 of 113 slices shown]
[im 38/113  soft-tissue]
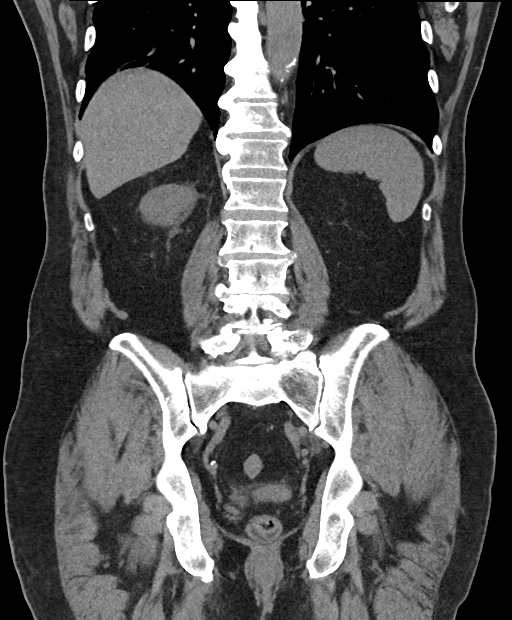
[im 50/113  soft-tissue]
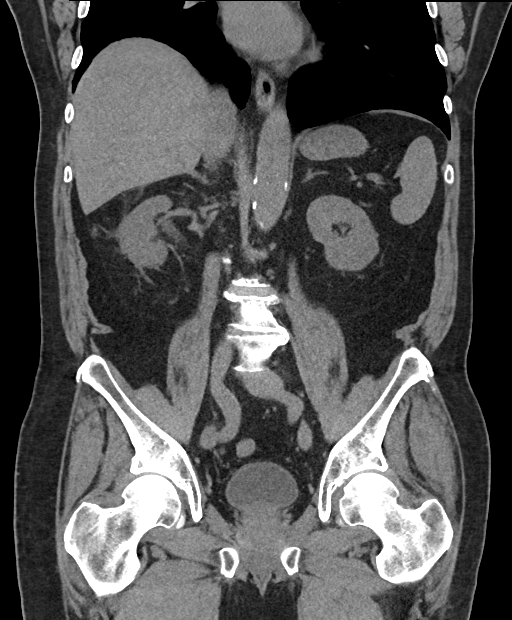
[im 63/113  soft-tissue]
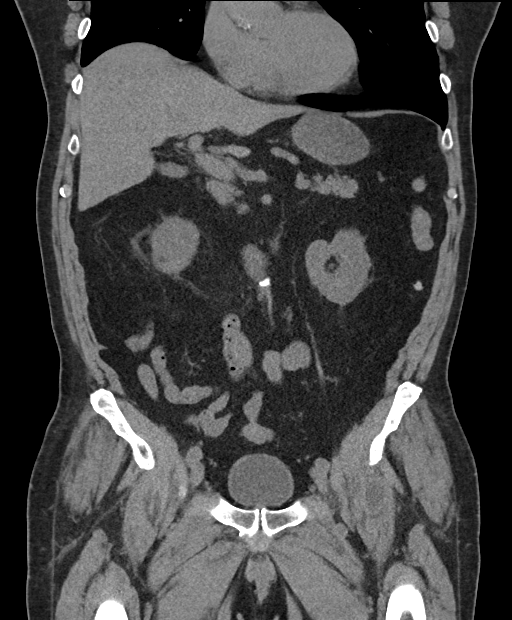

[16 of 46 positions shown; findings below may reference images not displayed]

FINDINGS: Lower chest: Calcified nodules at the lung bases indicative of prior
granulomatous inflammation.

Hepatobiliary: No focal liver abnormality is seen. No gallstones,
gallbladder wall thickening, or biliary dilatation.

Pancreas: Unremarkable. No pancreatic ductal dilatation or
surrounding inflammatory changes.

Spleen: No splenic injury or perisplenic hematoma.

Adrenals/Urinary Tract: Adrenal glands are normal in appearance.

Mild right hydronephrosis and hydroureter due to 4 mm obstructing
calculus in the right proximal ureter. Minimal right perinephric fat
stranding is present. Additional punctate nonobstructing calculus
seen in the mid right kidney. Kidneys otherwise normal. No
significant abnormality of the bladder.

Stomach/Bowel: Stomach is within normal limits. Appendix is not
definitively identified. No evidence of bowel wall thickening,
distention, or inflammatory changes.

Vascular/Lymphatic: No enlarged abdominal or pelvic lymph nodes.
There are scattered calcified atheromatous plaque seen throughout
the abdominal aorta.

Reproductive: Mildly enlarged prostate with central calcifications.

Other: Advanced degenerative changes of the lumbar spine.

Musculoskeletal: Advanced degenerative changes of the lumbar spine.
IMPRESSION: 4 mm obstructing calculus in the right proximal ureter resulting in
mild right hydronephrosis and hydroureter.

## 2024-02-17 ENCOUNTER — Other Ambulatory Visit (HOSPITAL_BASED_OUTPATIENT_CLINIC_OR_DEPARTMENT_OTHER): Payer: Self-pay | Admitting: Urology

## 2024-02-17 ENCOUNTER — Telehealth (HOSPITAL_BASED_OUTPATIENT_CLINIC_OR_DEPARTMENT_OTHER): Payer: Self-pay

## 2024-02-17 DIAGNOSIS — R310 Gross hematuria: Secondary | ICD-10-CM

## 2024-02-18 ENCOUNTER — Ambulatory Visit (HOSPITAL_BASED_OUTPATIENT_CLINIC_OR_DEPARTMENT_OTHER)
Admission: RE | Admit: 2024-02-18 | Discharge: 2024-02-18 | Disposition: A | Discharge status: Home / Self Care | Source: Ambulatory Visit | Attending: Urology | Admitting: Urology

## 2024-02-18 DIAGNOSIS — R310 Gross hematuria: Secondary | ICD-10-CM | POA: Diagnosis present

## 2024-03-01 ENCOUNTER — Other Ambulatory Visit: Payer: Self-pay | Admitting: Urology

## 2024-03-21 ENCOUNTER — Encounter (HOSPITAL_COMMUNITY): Payer: Self-pay | Admitting: Urology

## 2024-03-21 NOTE — Progress Notes (Addendum)
 Spoke w/ via phone for pre-op interview--- Devin Underwood needs dos---- BMP and EKG per anesthesia.        Underwood results------ COVID test -----patient states asymptomatic no test needed Arrive at -------0800 NPO after MN NO Solid Food.  Pre-Surgery Ensure or G2:  Med rec completed Medications to take morning of surgery -----NONE Diabetic medication -----  GLP1 agonist last dose: GLP1 instructions:  Patient instructed no nail polish to be worn day of surgery Patient instructed to bring photo id and insurance card day of surgery Patient aware to have Driver (ride ) / caregiver    for 24 hours after surgery - Wife Devin Underwood Patient Special Instructions ----- Shower with antibacterial soap. Per patient he was instructed to hold ASA starting 7 days prior to surgery. Pre-Op special Instructions -----  Patient verbalized understanding of instructions that were given at this phone interview. Patient denies chest pain, sob, fever, cough at the interview.

## 2024-03-28 NOTE — Progress Notes (Signed)
 Called and talked to patient about time change in surgery schedule. To be here at 0700 clear liquids until 0600. Ok with time change

## 2024-03-29 ENCOUNTER — Encounter (HOSPITAL_COMMUNITY)
Admission: RE | Disposition: A | Discharge status: Home / Self Care | Payer: Self-pay | Source: Home / Self Care | Attending: Urology

## 2024-03-29 ENCOUNTER — Ambulatory Visit (HOSPITAL_COMMUNITY): Admitting: Anesthesiology

## 2024-03-29 ENCOUNTER — Ambulatory Visit (HOSPITAL_COMMUNITY)
Admission: RE | Admit: 2024-03-29 | Discharge: 2024-03-29 | Disposition: A | Discharge status: Home / Self Care | Attending: Urology | Admitting: Urology

## 2024-03-29 ENCOUNTER — Encounter (HOSPITAL_COMMUNITY): Payer: Self-pay | Admitting: Urology

## 2024-03-29 ENCOUNTER — Ambulatory Visit (HOSPITAL_COMMUNITY)

## 2024-03-29 DIAGNOSIS — Z87891 Personal history of nicotine dependence: Secondary | ICD-10-CM | POA: Insufficient documentation

## 2024-03-29 DIAGNOSIS — Z79899 Other long term (current) drug therapy: Secondary | ICD-10-CM | POA: Insufficient documentation

## 2024-03-29 DIAGNOSIS — M1990 Unspecified osteoarthritis, unspecified site: Secondary | ICD-10-CM | POA: Insufficient documentation

## 2024-03-29 DIAGNOSIS — Z7982 Long term (current) use of aspirin: Secondary | ICD-10-CM | POA: Diagnosis not present

## 2024-03-29 DIAGNOSIS — I491 Atrial premature depolarization: Secondary | ICD-10-CM | POA: Diagnosis not present

## 2024-03-29 DIAGNOSIS — Z87442 Personal history of urinary calculi: Secondary | ICD-10-CM | POA: Insufficient documentation

## 2024-03-29 DIAGNOSIS — N210 Calculus in bladder: Secondary | ICD-10-CM

## 2024-03-29 DIAGNOSIS — Z96653 Presence of artificial knee joint, bilateral: Secondary | ICD-10-CM | POA: Diagnosis not present

## 2024-03-29 DIAGNOSIS — I10 Essential (primary) hypertension: Secondary | ICD-10-CM | POA: Insufficient documentation

## 2024-03-29 HISTORY — DX: Personal history of urinary calculi: Z87.442

## 2024-03-29 HISTORY — PX: CYSTOSCOPY WITH LITHOLAPAXY: SHX1425

## 2024-03-29 LAB — BASIC METABOLIC PANEL WITH GFR
Anion gap: 8 (ref 5–15)
BUN: 16 mg/dL (ref 8–23)
CO2: 26 mmol/L (ref 22–32)
Calcium: 8.9 mg/dL (ref 8.9–10.3)
Chloride: 102 mmol/L (ref 98–111)
Creatinine, Ser: 0.97 mg/dL (ref 0.61–1.24)
GFR, Estimated: >60 mL/min (ref >60)
Glucose, Bld: 98 mg/dL (ref 70–99)
Potassium: 4.5 mmol/L (ref 3.5–5.1)
Sodium: 136 mmol/L (ref 135–145)

## 2024-03-29 SURGERY — CYSTOSCOPY, WITH BLADDER CALCULUS LITHOLAPAXY
Anesthesia: General

## 2024-03-29 MED ORDER — LIDOCAINE 2% (20 MG/ML) 5 ML SYRINGE
INTRAMUSCULAR | Status: DC | PRN
  Administered 2024-03-29: 40 mg via INTRAVENOUS

## 2024-03-29 MED ORDER — TRANEXAMIC ACID-NACL 1000-0.7 MG/100ML-% IV SOLN
INTRAVENOUS | Status: AC
  Filled 2024-03-29: qty 100

## 2024-03-29 MED ORDER — PROPOFOL 10 MG/ML IV BOLUS
INTRAVENOUS | Status: AC
  Filled 2024-03-29: qty 20

## 2024-03-29 MED ORDER — LACTATED RINGERS IV SOLN: INTRAVENOUS | Status: DC

## 2024-03-29 MED ORDER — ONDANSETRON HCL 4 MG/2ML IJ SOLN
INTRAMUSCULAR | Status: DC | PRN
  Administered 2024-03-29: 4 mg via INTRAVENOUS

## 2024-03-29 MED ORDER — CIPROFLOXACIN IN D5W 400 MG/200ML IV SOLN
400.0000 mg | INTRAVENOUS | Status: AC
  Administered 2024-03-29: 400 mg via INTRAVENOUS

## 2024-03-29 MED ORDER — DEXAMETHASONE SODIUM PHOSPHATE 10 MG/ML IJ SOLN
INTRAMUSCULAR | Status: DC | PRN
  Administered 2024-03-29: 10 mg via INTRAVENOUS

## 2024-03-29 MED ORDER — FENTANYL CITRATE (PF) 250 MCG/5ML IJ SOLN
INTRAMUSCULAR | Status: AC
Start: 2024-03-29 — End: 2024-03-29
  Filled 2024-03-29: qty 5

## 2024-03-29 MED ORDER — NITROFURANTOIN MONOHYD MACRO 100 MG PO CAPS: 100.0000 mg | ORAL_CAPSULE | Freq: Two times a day (BID) | ORAL | 0 refills | Status: AC

## 2024-03-29 MED ORDER — TRAMADOL HCL 50 MG PO TABS: 50.0000 mg | ORAL_TABLET | Freq: Four times a day (QID) | ORAL | 0 refills | Status: AC | PRN

## 2024-03-29 MED ORDER — CHLORHEXIDINE GLUCONATE 0.12 % MT SOLN
15.0000 mL | Freq: Once | OROMUCOSAL | Status: AC
  Administered 2024-03-29: 15 mL via OROMUCOSAL

## 2024-03-29 MED ORDER — TRANEXAMIC ACID-NACL 1000-0.7 MG/100ML-% IV SOLN
INTRAVENOUS | Status: DC | PRN
  Administered 2024-03-29: 1000 mg via INTRAVENOUS

## 2024-03-29 MED ORDER — STERILE WATER FOR IRRIGATION IR SOLN
Status: DC | PRN
  Administered 2024-03-29: 3000 mL

## 2024-03-29 MED ORDER — FENTANYL CITRATE (PF) 250 MCG/5ML IJ SOLN
INTRAMUSCULAR | Status: DC | PRN
  Administered 2024-03-29: 75 ug via INTRAVENOUS
  Administered 2024-03-29: 25 ug via INTRAVENOUS

## 2024-03-29 MED ORDER — KETOROLAC TROMETHAMINE 30 MG/ML IJ SOLN
INTRAMUSCULAR | Status: DC | PRN
Start: 2024-03-29 — End: 2024-03-29
  Administered 2024-03-29: 15 mg via INTRAVENOUS

## 2024-03-29 MED ORDER — CHLORHEXIDINE GLUCONATE 0.12 % MT SOLN
OROMUCOSAL | Status: AC
  Filled 2024-03-29: qty 15

## 2024-03-29 MED ORDER — SODIUM CHLORIDE 0.9 % IR SOLN
Status: DC | PRN
  Administered 2024-03-29: 3000 mL
  Administered 2024-03-29: 3000 mL via INTRAVESICAL

## 2024-03-29 MED ORDER — HYOSCYAMINE SULFATE 0.125 MG SL SUBL: 0.1250 mg | SUBLINGUAL_TABLET | SUBLINGUAL | 0 refills | Status: AC | PRN

## 2024-03-29 MED ORDER — PROPOFOL 10 MG/ML IV BOLUS
INTRAVENOUS | Status: DC | PRN
  Administered 2024-03-29: 150 mg via INTRAVENOUS
  Administered 2024-03-29: 50 mg via INTRAVENOUS

## 2024-03-29 MED ORDER — CIPROFLOXACIN IN D5W 400 MG/200ML IV SOLN
INTRAVENOUS | Status: AC
  Filled 2024-03-29: qty 200

## 2024-03-29 MED ORDER — ORAL CARE MOUTH RINSE: 15.0000 mL | Freq: Once | OROMUCOSAL | Status: AC

## 2024-03-29 SURGICAL SUPPLY — 13 items
BAG URO CATCHER STRL LF (MISCELLANEOUS) ×1 IMPLANT
CATH URETL OPEN END 6FR 70 (CATHETERS) IMPLANT
GLOVE BIO SURGEON STRL SZ7.5 (GLOVE) ×1 IMPLANT
GOWN STRL REUS W/ TWL LRG LVL3 (GOWN DISPOSABLE) ×1 IMPLANT
GOWN STRL SURGICAL XL XLNG (GOWN DISPOSABLE) ×1 IMPLANT
KIT TURNOVER KIT B (KITS) ×1 IMPLANT
LASER FIB FLEXIVA PULSE ID 550 (Laser) ×1 IMPLANT
LASER FIB FLEXIVA PULSE ID 910 (Laser) ×1 IMPLANT
MANIFOLD NEPTUNE II (INSTRUMENTS) ×1 IMPLANT
PACK CYSTO (CUSTOM PROCEDURE TRAY) ×1 IMPLANT
SYRINGE TOOMEY IRRIG 70ML (MISCELLANEOUS) IMPLANT
TUBE CONNECTING 12X1/4 (SUCTIONS) IMPLANT
TUBING UROLOGY SET (TUBING) ×1 IMPLANT

## 2024-03-29 NOTE — Op Note (Signed)
 Preoperative diagnosis:  1. Bladder stone  Postoperative diagnosis: 1. same  Procedure(s): 1. Cystolithalopaxy (~1.5 cm stone)  Surgeon: Dr. Herlene Foot  Anesthesia: general  Complications: none  EBL: 10 cc  Specimens: Bladder stone  Intraoperative findings: ~1.5 cm stone in bladder, laser used to dust. Fragments evacuated through scope. Bugbee used to cauterize prostate  Description of procedure:  Patient was seen in preoperative holding.  Procedure risks explained in detail and consent signed.  Patient was brought to the operative suite where anesthesia was induced.  Prepped and draped in the usual sterile fashion in a dorsal lithotomy position.  Timeout was performed.  Began by carefully inserting the rigid cystoscope.  Bladder was carefully examined.  Urethra was notable for some trilobar enlargement of his prostate.  Aside from this time there were no abnormalities in the bladder.  A 550 m laser was then carefully used to fragment the stone.  Fragments were evacuated through the cystoscope.  Given the patient's prostate enlargement there was some bleeding afterward so I elected to use the Bugbee to fold straight these bleeders.  Patient was also given TXA.  Patient tolerated the procedure well was brought to the PACU in stable condition  Herlene Foot MD 03/29/2024, 10:54 AM  [facility]  Pager: [phone]

## 2024-03-29 NOTE — Anesthesia Postprocedure Evaluation (Signed)
 Anesthesia Post Note  Patient: Devin Underwood  Procedure(s) Performed: CYSTOSCOPY, WITH BLADDER CALCULUS LITHOLAPAXY     Patient location during evaluation: PACU Anesthesia Type: General Level of consciousness: awake and alert, oriented and patient cooperative Pain management: pain level controlled Vital Signs Assessment: post-procedure vital signs reviewed and stable Respiratory status: spontaneous breathing, nonlabored ventilation and respiratory function stable Cardiovascular status: blood pressure returned to baseline and stable Postop Assessment: no apparent nausea or vomiting and able to ambulate Anesthetic complications: no   No notable events documented.  Last Vitals:  Vitals:   03/29/24 1130 03/29/24 1138  BP: 128/74 118/67  Pulse: 63 63  Resp: 12 12  Temp:  36.4 C  SpO2: 95% 96%    Last Pain:  Vitals:   03/29/24 1103  TempSrc:   PainSc: 0-No pain                 Azuree Minish,E. Kesha Hurrell

## 2024-03-29 NOTE — Transfer of Care (Signed)
 Immediate Anesthesia Transfer of Care Note  Patient: Devin Underwood  Procedure(s) Performed: CYSTOSCOPY, WITH BLADDER CALCULUS LITHOLAPAXY  Patient Location: PACU  Anesthesia Type:General  Level of Consciousness: awake and alert   Airway & Oxygen Therapy: Patient Spontanous Breathing  Post-op Assessment: Report given to RN  Post vital signs: Reviewed and stable  Last Vitals:  Vitals Value Taken Time  BP 125/66 03/29/24 11:02  Temp    Pulse 77 03/29/24 11:04  Resp 13 03/29/24 11:04  SpO2 93 % 03/29/24 11:04  Vitals shown include unfiled device data.  Last Pain:  Vitals:   03/29/24 0713  TempSrc: Oral  PainSc: 0-No pain      Patients Stated Pain Goal: 5 (03/29/24 0713)  Complications: No notable events documented.

## 2024-03-29 NOTE — Anesthesia Preprocedure Evaluation (Addendum)
 Anesthesia Evaluation  Patient identified by MRN, date of birth, ID band Patient awake    Reviewed: Allergy & Precautions, NPO status , Patient's Chart, lab work & pertinent test results  History of Anesthesia Complications Negative for: history of anesthetic complications  Airway Mallampati: II  TM Distance: >3 FB Neck ROM: Full    Dental  (+) Dental Advisory Given   Pulmonary former smoker   breath sounds clear to auscultation       Cardiovascular hypertension, Pt. on medications (-) angina  Rhythm:Regular Rate:Normal  '22 ECHO: Left ventricular systolic function is normal.  LV ejection fraction = 65-70%.  There is no significant valvular stenosis or regurgitation.     Neuro/Psych negative neurological ROS     GI/Hepatic negative GI ROS, Neg liver ROS,,,  Endo/Other  BMI 34  Renal/GU negative Renal ROS   Bladder stones    Musculoskeletal  (+) Arthritis ,    Abdominal   Peds  Hematology negative hematology ROS (+)   Anesthesia Other Findings   Reproductive/Obstetrics                              Anesthesia Physical Anesthesia Plan  ASA: 3  Anesthesia Plan: General   Post-op Pain Management: Tylenol  PO (pre-op)*   Induction: Intravenous  PONV Risk Score and Plan: Ondansetron  and Dexamethasone   Airway Management Planned: LMA  Additional Equipment: None  Intra-op Plan:   Post-operative Plan:   Informed Consent: I have reviewed the patients History and Physical, chart, labs and discussed the procedure including the risks, benefits and alternatives for the proposed anesthesia with the patient or authorized representative who has indicated his/her understanding and acceptance.     Dental advisory given  Plan Discussed with: CRNA and Surgeon  Anesthesia Plan Comments:          Anesthesia Quick Evaluation

## 2024-03-29 NOTE — H&P (Signed)
 H&P  History of Present Illness: Devin Underwood is a 78 y.o. year old M who presents today for treatment of a bladder stone  No acute complaints  Past Medical History:  Diagnosis Date   History of kidney stones    Hypertension     Past Surgical History:  Procedure Laterality Date   REPLACEMENT TOTAL KNEE Bilateral     Home Medications:  Current Meds  Medication Sig   aspirin 81 MG EC tablet Take 81 mg by mouth daily.   atorvastatin (LIPITOR) 10 MG tablet Take 10 mg by mouth daily.   Cholecalciferol (VITAMIN D3 PO) Take 50 mcg by mouth daily.   fexofenadine (ALLEGRA) 60 MG tablet Take 180 mg by mouth daily.   fluticasone (FLONASE) 50 MCG/ACT nasal spray Place 1 spray into both nostrils daily.   lisinopril (ZESTRIL) 10 MG tablet Take 10 mg by mouth daily.   tamsulosin  (FLOMAX ) 0.4 MG CAPS capsule Take 1 capsule (0.4 mg total) by mouth daily.    Allergies:  Allergies  Allergen Reactions   Shellfish Allergy Other (See Comments)    Tongue swells    History reviewed. No pertinent family history.  Social History:  reports that he has quit smoking. His smoking use included cigarettes. He does not have any smokeless tobacco history on file. He reports current alcohol use. He reports that he does not currently use drugs.  ROS: A complete review of systems was performed.  All systems are negative except for pertinent findings as noted.  Physical Exam:  Vital signs in last 24 hours: Temp:  [97.6 F (36.4 C)] 97.6 F (36.4 C) (07/15 0713) Pulse Rate:  [65] 65 (07/15 0713) Resp:  [18] 18 (07/15 0713) BP: (121)/(75) 121/75 (07/15 0713) SpO2:  [94 %] 94 % (07/15 0713) Weight:  [104.3 kg] 104.3 kg (07/15 0713) Constitutional:  Alert and oriented, No acute distress Cardiovascular: Regular rate and rhythm Respiratory: Normal respiratory effort, Lungs clear bilaterally GI: Abdomen is soft, nontender, nondistended, no abdominal masses Lymphatic: No lymphadenopathy Neurologic:  Grossly intact, no focal deficits Psychiatric: Normal mood and affect   Laboratory Data:  No results for input(s): WBC, HGB, HCT, PLT in the last 72 hours.  No results for input(s): NA, K, CL, GLUCOSE, BUN, CALCIUM, CREATININE in the last 72 hours.  Invalid input(s): CO3   No results found for this or any previous visit (from the past 24 hours). No results found for this or any previous visit (from the past 240 hours).  Renal Function: No results for input(s): CREATININE in the last 168 hours. CrCl cannot be calculated (Patient's most recent lab result is older than the maximum 21 days allowed.).  Radiologic Imaging: No results found.  Assessment:  Devin Underwood is a 78 y.o. year old M with bladder stone   Plan:  --to OR as planned for cystolithalopaxy. Procedure and risks reviewed, including but not limited to hematuria, clot retention, infection, sepsis, damage to GU tract, failure to complete procedure, retained stone fragments, need for future procedures, retention, catheter.   Herlene Foot, MD 03/29/2024, 7:42 AM  [facility] Pager: [phone]

## 2024-03-29 NOTE — Anesthesia Procedure Notes (Signed)
 Procedure Name: LMA Insertion Date/Time: 03/29/2024 10:11 AM  Performed by: Delores Duwaine SAUNDERS, CRNAPre-anesthesia Checklist: Patient identified, Emergency Drugs available, Suction available and Patient being monitored Patient Re-evaluated:Patient Re-evaluated prior to induction Oxygen Delivery Method: Circle System Utilized Preoxygenation: Pre-oxygenation with 100% oxygen Induction Type: IV induction Ventilation: Mask ventilation without difficulty LMA: LMA inserted and LMA flexible inserted LMA Size: 5.0 Number of attempts: 2 (First attempt with size 5 gastroport with poor seal. Exchanged for regular size 5 LMA with adequate seal and good volumes.) Airway Equipment and Method: Bite block Placement Confirmation: positive ETCO2 Tube secured with: Tape Dental Injury: Teeth and Oropharynx as per pre-operative assessment

## 2024-03-29 NOTE — Discharge Instructions (Addendum)
CYSTOSCOPY HOME CARE INSTRUCTIONS  Activity: Rest for the remainder of the day.  Do not drive or operate equipment today.  You may resume normal activities in one to two days as instructed by your physician.   Meals: Drink plenty of liquids and eat light foods such as gelatin or soup this evening.  You may return to a normal meal plan tomorrow.  Return to Work: You may return to work in one to two days or as instructed by your physician.  Special Instructions / Symptoms: Call your physician if any of these symptoms occur:   -persistent or heavy bleeding  -bleeding which continues after first few urination  -large blood clots that are difficult to pass  -urine stream diminishes or stops completely  -fever equal to or higher than 101 degrees Farenheit.  -cloudy urine with a strong, foul odor  -severe pain     Post Anesthesia Home Care Instructions  Activity: Get plenty of rest for the remainder of the day. A responsible individual must stay with you for 24 hours following the procedure.  For the next 24 hours, DO NOT: -Drive a car -Paediatric nurse -Drink alcoholic beverages -Take any medication unless instructed by your physician -Make any legal decisions or sign important papers.  Meals: Start with liquid foods such as gelatin or soup. Progress to regular foods as tolerated. Avoid greasy, spicy, heavy foods. If nausea and/or vomiting occur, drink only clear liquids until the nausea and/or vomiting subsides. Call your physician if vomiting continues.  Special Instructions/Symptoms: Your throat may feel dry or sore from the anesthesia or the breathing tube placed in your throat during surgery. If this causes discomfort, gargle with warm salt water. The discomfort should disappear within 24 hours.

## 2024-03-30 ENCOUNTER — Encounter (HOSPITAL_COMMUNITY): Payer: Self-pay | Admitting: Urology
